# Patient Record
Sex: Female | Born: 1970 | Race: White | Hispanic: No | Marital: Married | Smoking: Never smoker
Health system: Southern US, Community
[De-identification: ages and names within clinical notes are randomized; demographics above are authoritative.]

## PROBLEM LIST (undated history)

## (undated) HISTORY — PX: DILATION AND CURETTAGE OF UTERUS: SHX78

---

## 2000-06-16 ENCOUNTER — Other Ambulatory Visit: Admission: RE | Admit: 2000-06-16 | Discharge: 2000-06-16 | Payer: Self-pay | Admitting: Obstetrics and Gynecology

## 2005-12-27 ENCOUNTER — Ambulatory Visit: Payer: Self-pay | Admitting: Internal Medicine

## 2006-12-17 ENCOUNTER — Ambulatory Visit: Payer: Self-pay | Admitting: Internal Medicine

## 2007-01-15 ENCOUNTER — Ambulatory Visit: Payer: Self-pay | Admitting: Internal Medicine

## 2007-01-23 ENCOUNTER — Ambulatory Visit: Payer: Self-pay | Admitting: Internal Medicine

## 2007-02-27 ENCOUNTER — Ambulatory Visit: Payer: Self-pay | Admitting: Internal Medicine

## 2008-09-12 ENCOUNTER — Encounter: Payer: Self-pay | Admitting: Internal Medicine

## 2009-10-25 ENCOUNTER — Ambulatory Visit: Payer: Self-pay | Admitting: Internal Medicine

## 2009-10-25 DIAGNOSIS — M549 Dorsalgia, unspecified: Secondary | ICD-10-CM | POA: Insufficient documentation

## 2009-11-07 ENCOUNTER — Encounter: Admission: RE | Admit: 2009-11-07 | Discharge: 2009-11-28 | Payer: Self-pay | Admitting: Internal Medicine

## 2009-11-28 ENCOUNTER — Encounter: Payer: Self-pay | Admitting: Internal Medicine

## 2010-11-11 ENCOUNTER — Emergency Department (HOSPITAL_BASED_OUTPATIENT_CLINIC_OR_DEPARTMENT_OTHER)
Admission: EM | Admit: 2010-11-11 | Discharge: 2010-11-11 | Payer: Self-pay | Source: Home / Self Care | Admitting: Emergency Medicine

## 2010-11-20 NOTE — Assessment & Plan Note (Signed)
Summary: back pain//ccm   Vital Signs:  Patient profile:   40 year old female Weight:      201 pounds Temp:     98.9 degrees F Pulse rate:   88 / minute Resp:     12 per minute BP sitting:   118 / 76  (left arm)  Vitals Entered By: Gladis Riffle, RN (October 25, 2009 10:24 AM)   History of Present Illness: October---developed back pain during a month long hx of cough. Location Mid-low back---can extend superiorly. increase pain with bending liftin, twisting.  Pain is acute with some movements but the discomfort is constant.  Once in a while she wonders whether pain extends to right leg  All other systems reviewed and were negative   Preventive Screening-Counseling & Management  Alcohol-Tobacco     Smoking Status: never  Allergies: 1)  ! Erythromycin 2)  ! Pcn 3)  ! Amoxicillin  Comments:  Nurse/Medical Assistant: c/o pain at base of spine intermittent x 3 months but worse last 3-4 weeks  The patient's medications and allergies were reviewed with the patient and were updated in the Medication and Allergy Lists. Gladis Riffle, RN (October 25, 2009 10:26 AM)  Past History:  Past Medical History: Last updated: 06/24/2007 Unremarkable  Past Surgical History: Last updated: 06/24/2007 Denies surgical history  Family History: Last updated: 06/24/2007 Family History of Alcoholism/Addiction Family History Diabetes 1st degree relative Family History Ovarian cancer Family History Uterine cancer Family History of Cardiovascular disorder  Social History: Last updated: 06/24/2007 Occupation: Married Never Smoked Alcohol use-yes Drug use-no Regular exercise-no  Risk Factors: Exercise: no (06/24/2007)  Risk Factors: Smoking Status: never (10/25/2009)  Review of Systems       All other systems reviewed and were negative   Physical Exam  Head:  normocephalic and atraumatic.   Eyes:  pupils equal and pupils round.   Neck:  No deformities, masses, or tenderness  noted. Chest Wall:  No deformities, masses, or tenderness noted. Lungs:  normal respiratory effort and no intercostal retractions.   Heart:  normal rate and regular rhythm.   Abdomen:  soft and non-tender.   Msk:  No deformity or scoliosis noted of thoracic or lumbar spine.   FROM both hips and LS spine Extremities:  No clubbing, cyanosis, edema, or deformity noted Neurologic:  strength normal in all extremities, gait normal, and DTRs symmetrical and normal in lower extremities   Impression & Recommendations:  Problem # 1:  BACK PAIN (ICD-724.5) sxs have been ongoing for months and are progressive needs furhter evalaution  start with xray may need PT---if no relief then more specific imaging Orders: T-Lumbar Spine Complete, 5 Views ([id number])  Complete Medication List: 1)  Has Iud

## 2010-11-20 NOTE — Miscellaneous (Signed)
Summary: Discharge Summary for PT [facility]  Discharge Summary for PT Services/[facility]   Imported By: Maryln Gottron 12/05/2009 14:08:18  _____________________________________________________________________  External Attachment:    Type:   Image     Comment:   External Document

## 2010-12-20 LAB — HM PAP SMEAR: HM Pap smear: NORMAL

## 2011-01-09 ENCOUNTER — Other Ambulatory Visit: Payer: BC Managed Care – PPO | Admitting: Internal Medicine

## 2011-01-09 DIAGNOSIS — Z0000 Encounter for general adult medical examination without abnormal findings: Secondary | ICD-10-CM

## 2011-01-09 LAB — CBC WITH DIFFERENTIAL/PLATELET
Eosinophils Absolute: 0.1 10*3/uL (ref 0.0–0.7)
Eosinophils Relative: 2.1 % (ref 0.0–5.0)
Lymphocytes Relative: 43.7 % (ref 12.0–46.0)
Lymphs Abs: 2.7 10*3/uL (ref 0.7–4.0)
MCV: 94 fl (ref 78.0–100.0)
Monocytes Relative: 6.1 % (ref 3.0–12.0)
Neutrophils Relative %: 47.5 % (ref 43.0–77.0)
Platelets: 186 10*3/uL (ref 150.0–400.0)

## 2011-01-09 LAB — BASIC METABOLIC PANEL
BUN: 12 mg/dL (ref 6–23)
CO2: 26 mEq/L (ref 19–32)
Calcium: 8.5 mg/dL (ref 8.4–10.5)
Chloride: 107 mEq/L (ref 96–112)
Potassium: 4 mEq/L (ref 3.5–5.1)
Sodium: 138 mEq/L (ref 135–145)

## 2011-01-09 LAB — HEPATIC FUNCTION PANEL
AST: 22 U/L (ref 0–37)
Alkaline Phosphatase: 66 U/L (ref 39–117)
Bilirubin, Direct: 0.1 mg/dL (ref 0.0–0.3)
Total Protein: 6.5 g/dL (ref 6.0–8.3)

## 2011-01-09 LAB — LIPID PANEL
Cholesterol: 186 mg/dL (ref 0–200)
VLDL: 21.2 mg/dL (ref 0.0–40.0)

## 2011-01-09 LAB — POCT URINALYSIS DIPSTICK
Glucose, UA: NEGATIVE
Spec Grav, UA: 1.03
Urobilinogen, UA: 0.2
pH, UA: 5.5

## 2011-01-09 LAB — TSH: TSH: 1.78 u[IU]/mL (ref 0.35–5.50)

## 2011-01-17 ENCOUNTER — Encounter: Payer: Self-pay | Admitting: Internal Medicine

## 2011-01-18 ENCOUNTER — Encounter: Payer: Self-pay | Admitting: Internal Medicine

## 2011-01-21 ENCOUNTER — Ambulatory Visit (INDEPENDENT_AMBULATORY_CARE_PROVIDER_SITE_OTHER): Payer: BC Managed Care – PPO | Admitting: Internal Medicine

## 2011-01-21 ENCOUNTER — Encounter: Payer: Self-pay | Admitting: Internal Medicine

## 2011-01-21 VITALS — BP 112/80 | HR 92 | Temp 98.6°F | Ht 64.0 in | Wt 215.0 lb

## 2011-01-21 DIAGNOSIS — M549 Dorsalgia, unspecified: Secondary | ICD-10-CM

## 2011-01-21 DIAGNOSIS — Z23 Encounter for immunization: Secondary | ICD-10-CM

## 2011-01-21 DIAGNOSIS — Z0000 Encounter for general adult medical examination without abnormal findings: Secondary | ICD-10-CM

## 2011-01-21 NOTE — Progress Notes (Signed)
  Subjective:    Patient ID: Kathy Maddox, female    DOB: 1971-08-31, 40 y.o.   MRN: [id number]  HPI  cpx Does complain of intermittent back pain. This was reviewed one year ago. Images reviewed at that time. No past medical history on file. Past Surgical History  Procedure Date  . Cesarean section   . Dilation and curettage of uterus     reports that she has never smoked. She does not have any smokeless tobacco history on file. She reports that she drinks alcohol. She reports that she does not use illicit drugs. family history includes Alcohol abuse in her paternal grandmother; Cancer in her paternal grandmother; Diabetes in her maternal grandfather and maternal grandmother; and Heart disease in her maternal grandmother. Allergies  Allergen Reactions  . Amoxicillin   . Erythromycin   . Penicillins      Review of Systems  patient denies chest pain, shortness of breath, orthopnea. Denies lower extremity edema, abdominal pain, change in appetite, change in bowel movements. Patient denies rashes, musculoskeletal complaints. No other specific complaints in a complete review of systems.      Objective:   Physical Exam  Well-developed well-nourished female in no acute distress. HEENT exam atraumatic, normocephalic, extraocular muscles are intact. Neck is supple. No jugular venous distention no thyromegaly. Chest clear to auscultation without increased work of breathing. Cardiac exam S1 and S2 are regular. Abdominal exam active bowel sounds, soft, nontender. Extremities no edema. Neurologic exam she is alert without any motor sensory deficits. Gait is normal.      Assessment & Plan:  Well visit- Health maint UTD

## 2011-02-28 ENCOUNTER — Ambulatory Visit (INDEPENDENT_AMBULATORY_CARE_PROVIDER_SITE_OTHER): Payer: BC Managed Care – PPO | Admitting: Internal Medicine

## 2011-02-28 ENCOUNTER — Encounter: Payer: Self-pay | Admitting: Internal Medicine

## 2011-02-28 DIAGNOSIS — J069 Acute upper respiratory infection, unspecified: Secondary | ICD-10-CM

## 2011-02-28 MED ORDER — AZITHROMYCIN 250 MG PO TABS: ORAL_TABLET | ORAL | Status: AC

## 2011-03-03 ENCOUNTER — Encounter: Payer: Self-pay | Admitting: Internal Medicine

## 2011-03-03 DIAGNOSIS — J069 Acute upper respiratory infection, unspecified: Secondary | ICD-10-CM | POA: Insufficient documentation

## 2011-03-03 NOTE — Assessment & Plan Note (Signed)
Begin Z-Pak.Followup if no improvement or worsening.

## 2011-03-03 NOTE — Progress Notes (Signed)
  Subjective:    Patient ID: Kathy Maddox, female    DOB: 05-27-1971, 40 y.o.   MRN: [id number]  HPI Patient presents to clinic for evaluation of cough. Notes approximately one week history of nasal congestion, left maxillary pressure, malaise and cough. Denies fever, chills, shortness breath, wheezing or hemoptysis. Took over-the-counter Sudafed. No other alleviating or exacerbating factors. Allergies reviewed with erythromycin causing GI upset but has tolerated Zithromax in the past. No other complaints.  Reviewed past medical history, medications and allergies  Review of Systems see history of present illness     Objective:   Physical Exam    Physical Exam  Vitals reviewed. Constitutional:  appears well-developed and well-nourished. No distress.  HENT:  Head: Normocephalic and atraumatic.  Right Ear: Tympanic membrane, external ear and ear canal normal.  Left Ear: Tympanic membrane, external ear and ear canal normal.  Nose: Nose normal.  Mouth/Throat: Oropharynx is clear and moist. No oropharyngeal exudate.  Eyes: Conjunctivae and EOM are normal. Pupils are equal, round, and reactive to light. Right eye exhibits no discharge. Left eye exhibits no discharge. No scleral icterus.  Neck: Neck supple. No thyromegaly present.  Cardiovascular: Normal rate, regular rhythm and normal heart sounds.  Exam reveals no gallop and no friction rub.   No murmur heard. Pulmonary/Chest: Effort normal and breath sounds normal. No respiratory distress.  has no wheezes.  has no rales.  Lymphadenopathy:   no cervical adenopathy.  Neurological:  is alert.  Skin: Skin is warm and dry.  not diaphoretic.  Psychiatric: normal mood and affect.      Assessment & Plan:

## 2011-06-03 ENCOUNTER — Ambulatory Visit: Payer: BC Managed Care – PPO | Admitting: Family Medicine

## 2011-06-03 ENCOUNTER — Ambulatory Visit (INDEPENDENT_AMBULATORY_CARE_PROVIDER_SITE_OTHER): Payer: BC Managed Care – PPO | Admitting: Internal Medicine

## 2011-06-03 ENCOUNTER — Encounter: Payer: Self-pay | Admitting: Internal Medicine

## 2011-06-03 DIAGNOSIS — IMO0001 Reserved for inherently not codable concepts without codable children: Secondary | ICD-10-CM

## 2011-06-03 DIAGNOSIS — R21 Rash and other nonspecific skin eruption: Secondary | ICD-10-CM

## 2011-06-03 DIAGNOSIS — M7918 Myalgia, other site: Secondary | ICD-10-CM

## 2011-06-03 MED ORDER — TRIAMCINOLONE ACETONIDE 0.025 % EX OINT: TOPICAL_OINTMENT | Freq: Two times a day (BID) | CUTANEOUS | Status: DC

## 2011-06-05 DIAGNOSIS — R21 Rash and other nonspecific skin eruption: Secondary | ICD-10-CM | POA: Insufficient documentation

## 2011-06-05 DIAGNOSIS — M7918 Myalgia, other site: Secondary | ICD-10-CM | POA: Insufficient documentation

## 2011-06-05 NOTE — Assessment & Plan Note (Signed)
Attempt triamcinolone to affected area twice a day.Followup if no improvement or worsening.

## 2011-06-05 NOTE — Assessment & Plan Note (Signed)
Likely ligamental. Exam does not suggest hip involvement. Attempt Advil p.r.n. With food and no other anti-inflammatories.Followup if no improvement or worsening.

## 2011-06-05 NOTE — Progress Notes (Signed)
  Subjective:    Patient ID: CHERELLE MIDKIFF, female    DOB: Aug 19, 1971, 40 y.o.   MRN: [id number]  HPI patient presents to clinic for evaluation of rash. Next history of rash on right upper thigh. Is not spreading. No exacerbating or alleviating factors. No associated pain. Positive itching. Is not any dermatomal distribution. Also notes intermittent waxing and waning right inguinal pain along musculoskeletal distribution. No injury or trauma to the hip and does not worsen with ambulation. No other complaints  Reviewed past medical history, medications and allergies  Review of Systems see history of present illness     Objective:   Physical Exam  Nursing note and vitals reviewed. Constitutional: She appears well-developed and well-nourished.  HENT:  Head: Normocephalic and atraumatic.  Right Ear: External ear normal.  Left Ear: External ear normal.  Eyes: Conjunctivae are normal. No scleral icterus.  Musculoskeletal:       Range of motion right hip. No reproducible pain on flexion extension internal or external rotation. Gait normal  Neurological: She is alert.  Skin: Skin is warm and dry.       Right medial thigh demonstrates maculopapular erythematous rash.no drainage. no dermatomal distribution.  Psychiatric: She has a normal mood and affect.          Assessment & Plan:

## 2015-06-08 ENCOUNTER — Encounter (HOSPITAL_BASED_OUTPATIENT_CLINIC_OR_DEPARTMENT_OTHER): Payer: Self-pay | Admitting: Emergency Medicine

## 2015-06-08 ENCOUNTER — Emergency Department (HOSPITAL_BASED_OUTPATIENT_CLINIC_OR_DEPARTMENT_OTHER): Payer: BLUE CROSS/BLUE SHIELD

## 2015-06-08 ENCOUNTER — Encounter: Payer: Self-pay | Admitting: Osteopathic Medicine

## 2015-06-08 ENCOUNTER — Ambulatory Visit (INDEPENDENT_AMBULATORY_CARE_PROVIDER_SITE_OTHER): Payer: BLUE CROSS/BLUE SHIELD | Admitting: Osteopathic Medicine

## 2015-06-08 ENCOUNTER — Emergency Department (HOSPITAL_BASED_OUTPATIENT_CLINIC_OR_DEPARTMENT_OTHER)
Admission: EM | Admit: 2015-06-08 | Discharge: 2015-06-08 | Disposition: A | Discharge status: Home / Self Care | Payer: BLUE CROSS/BLUE SHIELD | Attending: Emergency Medicine | Admitting: Emergency Medicine

## 2015-06-08 VITALS — BP 142/78 | HR 116 | Temp 102.7°F | Ht 64.0 in | Wt 253.0 lb

## 2015-06-08 DIAGNOSIS — Z79899 Other long term (current) drug therapy: Secondary | ICD-10-CM | POA: Insufficient documentation

## 2015-06-08 DIAGNOSIS — R000 Tachycardia, unspecified: Secondary | ICD-10-CM | POA: Insufficient documentation

## 2015-06-08 DIAGNOSIS — J159 Unspecified bacterial pneumonia: Secondary | ICD-10-CM | POA: Insufficient documentation

## 2015-06-08 DIAGNOSIS — J189 Pneumonia, unspecified organism: Secondary | ICD-10-CM

## 2015-06-08 DIAGNOSIS — R5081 Fever presenting with conditions classified elsewhere: Secondary | ICD-10-CM

## 2015-06-08 DIAGNOSIS — R059 Cough, unspecified: Secondary | ICD-10-CM

## 2015-06-08 DIAGNOSIS — R509 Fever, unspecified: Secondary | ICD-10-CM | POA: Diagnosis present

## 2015-06-08 DIAGNOSIS — Z880 Allergy status to penicillin: Secondary | ICD-10-CM | POA: Insufficient documentation

## 2015-06-08 DIAGNOSIS — R05 Cough: Secondary | ICD-10-CM

## 2015-06-08 DIAGNOSIS — R011 Cardiac murmur, unspecified: Secondary | ICD-10-CM

## 2015-06-08 LAB — RAPID STREP SCREEN (MED CTR MEBANE ONLY): Streptococcus, Group A Screen (Direct): NEGATIVE

## 2015-06-08 MED ORDER — MORPHINE SULFATE (PF) 4 MG/ML IV SOLN
4.0000 mg | Freq: Once | INTRAVENOUS | Status: DC
  Filled 2015-06-08: qty 1

## 2015-06-08 MED ORDER — IBUPROFEN 800 MG PO TABS
800.0000 mg | ORAL_TABLET | Freq: Once | ORAL | Status: AC
  Administered 2015-06-08: 800 mg via ORAL
  Filled 2015-06-08: qty 1

## 2015-06-08 MED ORDER — LEVOFLOXACIN 750 MG PO TABS: 750.0000 mg | ORAL_TABLET | Freq: Every day | ORAL | Status: AC

## 2015-06-08 MED ORDER — BENZONATATE 200 MG PO CAPS: 200.0000 mg | ORAL_CAPSULE | Freq: Three times a day (TID) | ORAL | Status: DC | PRN

## 2015-06-08 MED ORDER — LEVOFLOXACIN IN D5W 750 MG/150ML IV SOLN
750.0000 mg | Freq: Once | INTRAVENOUS | Status: AC
  Administered 2015-06-08: 750 mg via INTRAVENOUS
  Filled 2015-06-08: qty 150

## 2015-06-08 MED ORDER — IPRATROPIUM-ALBUTEROL 18-103 MCG/ACT IN AERO: 2.0000 | INHALATION_SPRAY | RESPIRATORY_TRACT | Status: DC | PRN

## 2015-06-08 MED ORDER — SODIUM CHLORIDE 0.9 % IV BOLUS (SEPSIS)
1000.0000 mL | Freq: Once | INTRAVENOUS | Status: AC
  Administered 2015-06-08: 1000 mL via INTRAVENOUS

## 2015-06-08 MED ORDER — IPRATROPIUM-ALBUTEROL 0.5-2.5 (3) MG/3ML IN SOLN
3.0000 mL | Freq: Once | RESPIRATORY_TRACT | Status: AC
  Administered 2015-06-08: 3 mL via RESPIRATORY_TRACT

## 2015-06-08 MED ORDER — LEVOFLOXACIN 750 MG PO TABS
750.0000 mg | ORAL_TABLET | Freq: Every day | ORAL | Status: DC
Start: 2015-06-08 — End: 2015-06-08

## 2015-06-08 MED ORDER — ACETAMINOPHEN 325 MG PO TABS
650.0000 mg | ORAL_TABLET | Freq: Once | ORAL | Status: AC | PRN
  Administered 2015-06-08: 650 mg via ORAL
  Filled 2015-06-08: qty 2

## 2015-06-08 MED ORDER — BENZONATATE 100 MG PO CAPS: 100.0000 mg | ORAL_CAPSULE | Freq: Three times a day (TID) | ORAL | Status: AC | PRN

## 2015-06-08 NOTE — ED Notes (Signed)
Pt doesn't want morphine, gave motrin 

## 2015-06-08 NOTE — ED Notes (Signed)
Pt complains of sore throat, dry cough. Denies SOB. Lung sounds clear, throat pink

## 2015-06-08 NOTE — Discharge Instructions (Signed)

## 2015-06-08 NOTE — ED Provider Notes (Signed)
CSN: [id number]     Arrival date & time 06/08/15  1451 History   First MD Initiated Contact with Patient 06/08/15 1456     Chief Complaint  Patient presents with  . Sore Throat  . Fever  . Cough     (Consider location/radiation/quality/duration/timing/severity/associated sxs/prior Treatment) HPI   A generally healthy 44 year old obese female presenting for evaluation of fever and cough. Patient report for the past 3 days she has had subjective fever, chills, body aches, dry cough, pleuritic chest pain, and mild sore throat. Patient recently Back from a trip to the beach and was sick during the trip. She denies severe headache, runny nose, sneezing, ear pain, or sore throat. No nausea vomiting diarrhea, no shortness of breath, dyspnea on exertion, hemoptysis, back pain, abdominal pain, or rash. She tries taking home medication include Benadryl, Delsym, and Advil without adequate relief. She initially went to [facility] for this complaint today but was recommended to come to ER for additional blood work. Patient is not a smoker. No prior history of PE or DVT and no other significant risk factors for blood clot.  History reviewed. No pertinent past medical history. Past Surgical History  Procedure Laterality Date  . Cesarean section    . Dilation and curettage of uterus     Family History  Problem Relation Age of Onset  . Diabetes Maternal Grandmother   . Heart disease Maternal Grandmother   . Diabetes Maternal Grandfather   . Alcohol abuse Paternal Grandmother   . Cancer Paternal Grandmother     ovarian   Social History  Substance Use Topics  . Smoking status: Never Smoker   . Smokeless tobacco: Never Used  . Alcohol Use: Yes   OB History    No data available     Review of Systems  All other systems reviewed and are negative.     Allergies  Amoxicillin; Erythromycin; and Penicillins  Home Medications   Prior to Admission medications   Medication Sig Start Date End Date  Taking? Authorizing Provider  albuterol-ipratropium (COMBIVENT) 18-103 MCG/ACT inhaler Inhale 2 puffs into the lungs every 4 (four) hours as needed for wheezing or shortness of breath. 06/08/15   Sunnie Nielsen, DO  benzonatate (TESSALON) 200 MG capsule Take 1 capsule (200 mg total) by mouth 3 (three) times daily as needed for cough. 06/08/15   Sunnie Nielsen, DO  levofloxacin (LEVAQUIN) 750 MG tablet Take 1 tablet (750 mg total) by mouth daily. 06/08/15   Sunnie Nielsen, DO  norgestimate-ethinyl estradiol (ORTHO-CYCLEN,SPRINTEC,PREVIFEM) 0.25-35 MG-MCG tablet Take 1 tablet by mouth daily.    Historical Provider, MD   BP 144/49 mmHg  Pulse 126  Temp(Src) 102.2 F (39 C) (Oral)  Resp 22  Ht  (1.626 m)  Wt 250 lb (113.399 kg)  BMI 42.89 kg/m2  SpO2 97%  LMP 05/23/2015 Physical Exam  Constitutional: She is oriented to person, place, and time. She appears well-developed and well-nourished. No distress.  Moderately obese Caucasian female, nontoxic in appearance  HENT:  Head: Atraumatic.  Right Ear: External ear normal.  Left Ear: External ear normal.  Nose: Nose normal.  Mouth/Throat: Oropharynx is clear and moist.  Eyes: Conjunctivae are normal.  Neck: Normal range of motion. Neck supple.  No nuchal rigidity  Cardiovascular: Intact distal pulses.   Tachycardia without murmurs rubs or gallops.  Pulmonary/Chest: Effort normal and breath sounds normal. No respiratory distress. She has no wheezes. She has no rales. She exhibits no tenderness.  Abdominal: Soft.  Musculoskeletal:  She exhibits no edema.  Lymphadenopathy:    She has no cervical adenopathy.  Neurological: She is alert and oriented to person, place, and time.  Skin: No rash noted.  Psychiatric: She has a normal mood and affect.  Nursing note and vitals reviewed.   ED Course  Procedures (including critical care time)  Patient presents with fever, cough, and sore throat. Strep test is negative, chest x-ray  demonstratea right upper lobe pneumonia with involvement of portions of the anterior segment and axillary subsegment of the right upper lobe. This is consistence with community-acquired pneumonia. She is febrile with a temp of 102.2 and tachycardic, tachypneic but no hypertension. No hypoxia. Low suspicion for PE using Wells criteria. Since patient able to tolerates by mouth, recommend hydration while in the ED. Tylenol given for her fever. I offer antibiotic but patient states she prefers to take it later when she has food in the stomach, as previous antibiotic upsets her stomach. I do not think patient will require hospital admission at this time. I will continue to monitor patient and likely discharged with Levaquin for treatments of CAP.    5:47 PM Patient received IV fluid, IV antibiotic, and fever reducing medication while in the ED. Her symptoms improved greatly. She is now stable for discharge. Return precautions discussed.  Labs Review Labs Reviewed  RAPID STREP SCREEN (NOT AT [facility])  CULTURE, GROUP A STREP    Imaging Review Dg Chest 2 View  06/08/2015   CLINICAL DATA:  Cough and fever for several days  EXAM: CHEST  2 VIEW  COMPARISON:  None.  FINDINGS: There is patchy airspace consolidation in the right upper lobe, involving portions of the anterior segment and axillary subsegment of the right upper lobe. Lungs elsewhere are clear. Heart size and pulmonary vascularity are normal. No adenopathy. No bone lesions.  IMPRESSION: Right upper lobe pneumonia with involvement of portions of the anterior segment and axillary subsegment of the right upper lobe.   Electronically Signed   By: Bretta Bang III M.D.   On: 06/08/2015 15:18   I have personally reviewed and evaluated these images and lab results as part of my medical decision-making.   EKG Interpretation None      MDM   Final diagnoses:  CAP (community acquired pneumonia)    BP 132/72 mmHg  Pulse 78  Temp(Src) 98.7 F (37.1  C) (Oral)  Resp 16  Ht 5\' 4"  (1.626 m)  Wt 250 lb (113.399 kg)  BMI 42.89 kg/m2  SpO2 97%  LMP 05/23/2015  I have reviewed nursing notes and vital signs. I personally viewed the imaging tests through PACS system and agrees with radiologist's intepretation I reviewed available ER/hospitalization records through the EMR     Fayrene Helper, PA-C 06/08/15 1749  Elwin Mocha, MD 06/08/15 [phone]

## 2015-06-08 NOTE — Progress Notes (Signed)
CC: cough, feeling sick  HPI: Kathy Maddox is a 44 y.o. female who presents to [facility]  today for new patient and feeling sick  Reports coughing,  Onset: 3-4 days ago Duration: 3-4 days Quality: occasionally productive, no blood Associated symptoms: no SOB, low appetite, no sick contacts, no chest pain but some doscomfort in chest with coughing, no history of asthma or respiratory problems.  Modifying factors: Tried advil, benadryl and Delsym with little benefit. No exposure to nursing homes or hospitals.    Past medical, social and family history reviewed:  Past Medical History  Reports Miscarriage in 02/2015, previous cardiac workup she thinks for murmur in pregnancy, doesn't think she has had Echo performed  Past Surgical History  Procedure Laterality Date  . Cesarean section    . Dilation and curettage of uterus     Social History  Substance Use Topics  . Smoking status: Never Smoker   . Smokeless tobacco: Never Used  . Alcohol Use: Yes   Family History  Problem Relation Age of Onset  . Diabetes Maternal Grandmother   . Heart disease Maternal Grandmother   . Diabetes Maternal Grandfather   . Alcohol abuse Paternal Grandmother   . Cancer Paternal Grandmother     ovarian    Current Outpatient Prescriptions  Medication Sig Dispense Refill  . norgestimate-ethinyl estradiol (ORTHO-CYCLEN,SPRINTEC,PREVIFEM) 0.25-35 MG-MCG tablet Take 1 tablet by mouth daily.     No current facility-administered medications for this visit.   Allergies  Allergen Reactions  . Amoxicillin   . Erythromycin   . Penicillins      Review of Systems: CONSTITUTIONAL: (+) fever/chills, no unintentional weight changes HEAD/EYES/EARS/NOSE: No headache/vision change or hearing change CARDIAC: No chest pain/pressure/palpitations, no orthopnea RESPIRATORY: (+) cough - productive, mild shortness of breath/wheeze GASTROINTESTINAL: No  nausea/vomiting/abdominal pain/blood in stool/diarrhea/constipation MUSCULOSKELETAL: (+) myalgia/arthralgia GENITOURINARY: No incontinence, No abnormal genital bleeding/discharge SKIN: No rash/wounds/concerning lesions HEM/ONC: No easy bruising/bleeding, no abnormal lymph node PSYCHIATRIC: No concerns with depression/anxiety or sleep problems    Exam:  BP 142/78 mmHg  Pulse 116  Temp(Src) 102.7 F (39.3 C) (Oral)  Ht 5\' 4"  (1.626 m)  Wt 253 lb (114.76 kg)  BMI 43.41 kg/m2  SpO2 96%  LMP 05/23/2015 Repeat SaO2 and HR after breathing treatment: 96% and 126 bpm Constitutional: VS see above. General Appearance: alert, well-developed, well-nourished, NAD, anxious Eyes: Normal lids and conjunctive, non-icteric sclera, PERRLA Ears, Nose, Mouth, Throat: Normal external inspection ears/nares/mouth/lips/gums, Normal TM bilaterally, MMM, posterior pharynx without erythema/exudate Neck: No masses, trachea midline. No thyroid tenderness/mass appreciated but thyroid is mild enlarged.  Respiratory: Normal respiratory effort. No dullness/hyper-resonance to percussion. Breath sounds (+) wheeze/ronchi bilaterally, worse on L, improved after breathing treatment in office however SaO2 was no better Cardiovascular: S1/S2 normal, (+) systolic murmur most pronounced at LSB, Tachycardic, no rub/gallop auscultated. No carotid bruit or JVD. No abdominal aortic bruit. Pedal pulse II/IV bilaterally DP and PT. No lower extremity edema. Gastrointestinal: Nontender, no masses. No hepatomegaly, no splenomegaly. No hernia appreciated. Rectal exam deferred.  Musculoskeletal: Gait normal. No clubbing/cyanosis of digits.  Neurological: No cranial nerve deficit on limited exam. Motor and sensation intact and symmetric Psychiatric: Normal judgment/insight. Anxious mood and affect. Oriented x3.    No results found for this or any previous visit (from the past 72 hour(s)). Dg Chest 2 View  06/08/2015   CLINICAL DATA:   Cough and fever for several days  EXAM: CHEST  2 VIEW  COMPARISON:  None.  FINDINGS: There is patchy airspace consolidation in the right upper lobe, involving portions of the anterior segment and axillary subsegment of the right upper lobe. Lungs elsewhere are clear. Heart size and pulmonary vascularity are normal. No adenopathy. No bone lesions.  IMPRESSION: Right upper lobe pneumonia with involvement of portions of the anterior segment and axillary subsegment of the right upper lobe.   Electronically Signed   By: Bretta Bang III M.D.   On: 06/08/2015 15:18     ASSESSMENT/PLAN:  (+)SIRS/Sepsis likely due to community acquired pneumonia - based on tachycardia and temperature alone with suspicion for pneumonia. I recommended further evaluation in ER for expedient diagnosis/treatment, likely will require early goa directed therapy for sepsis, and rule out PE.   CAP (community acquired pneumonia) -, ipratropium-albuterol (DUONEB) 0.5-2.5 (3) MG/3ML nebulizer solution 3 mL in office, CANCELED: DG Chest 2 View, CANCELED: CBC with Differential/Platelet, CANCELED: Blood gas, arterial, CANCELED: COMPLETE METABOLIC PANEL WITH GFR  Cough - Plan: benzonatate (TESSALON) 200 MG capsule, ipratropium-albuterol (DUONEB) 0.5-2.5 (3) MG/3ML nebulizer solution 3 mL, CANCELED: DG Chest 2 View, CANCELED: COMPLETE METABOLIC PANEL WITH GFR  Fever presenting with conditions classified elsewhere - Plan: CANCELED: CBC with Differential/Platelet, CANCELED: COMPLETE METABOLIC PANEL WITH GFR  Tachycardia up to 126 - most likely due to illness but will eval w/ EKG, labs cancelled b/c patient went to ER - Plan, EKG 12-Lead see below, CANCELED: CBC with Differential/Platelet, CANCELED: D-Dimer, Quantitative  Heart murmur - Plan: EKG 12-Lead, obtain cardiology records  EKG INTERPRETATION: Rate 114, no ST/T changes concerning for ischemia/infarct, Sinus rhythm, tachycardia, RBB minimal WELLS SCORE 1.5 - LOW RISK  Patient  opts to go to ER. I personally Called [facility] ER - Asencion Islam in triage was given sign-out, patient has husband to drive her to ER. Orders and prescriptions cancelled here.

## 2015-06-08 NOTE — ED Notes (Signed)
Patient states she went to [facility] [place] and was sent here for blood work. The patient reports that she is having a sore throat and cough.

## 2015-06-08 NOTE — Patient Instructions (Signed)

## 2015-06-08 NOTE — ED Notes (Signed)
Pt given 2 rx at discharge. Denies any pain

## 2015-06-10 LAB — CULTURE, GROUP A STREP: STREP A CULTURE: NEGATIVE

## 2015-06-12 ENCOUNTER — Ambulatory Visit: Payer: BLUE CROSS/BLUE SHIELD | Admitting: Osteopathic Medicine

## 2017-02-28 IMAGING — DX DG CHEST 2V
2 series · 2 of 2 positions shown · non-contrast
Comparison: None.

CLINICAL DATA: Cough and fever for several days

EXAM:
CHEST  2 VIEW

[chest pa]
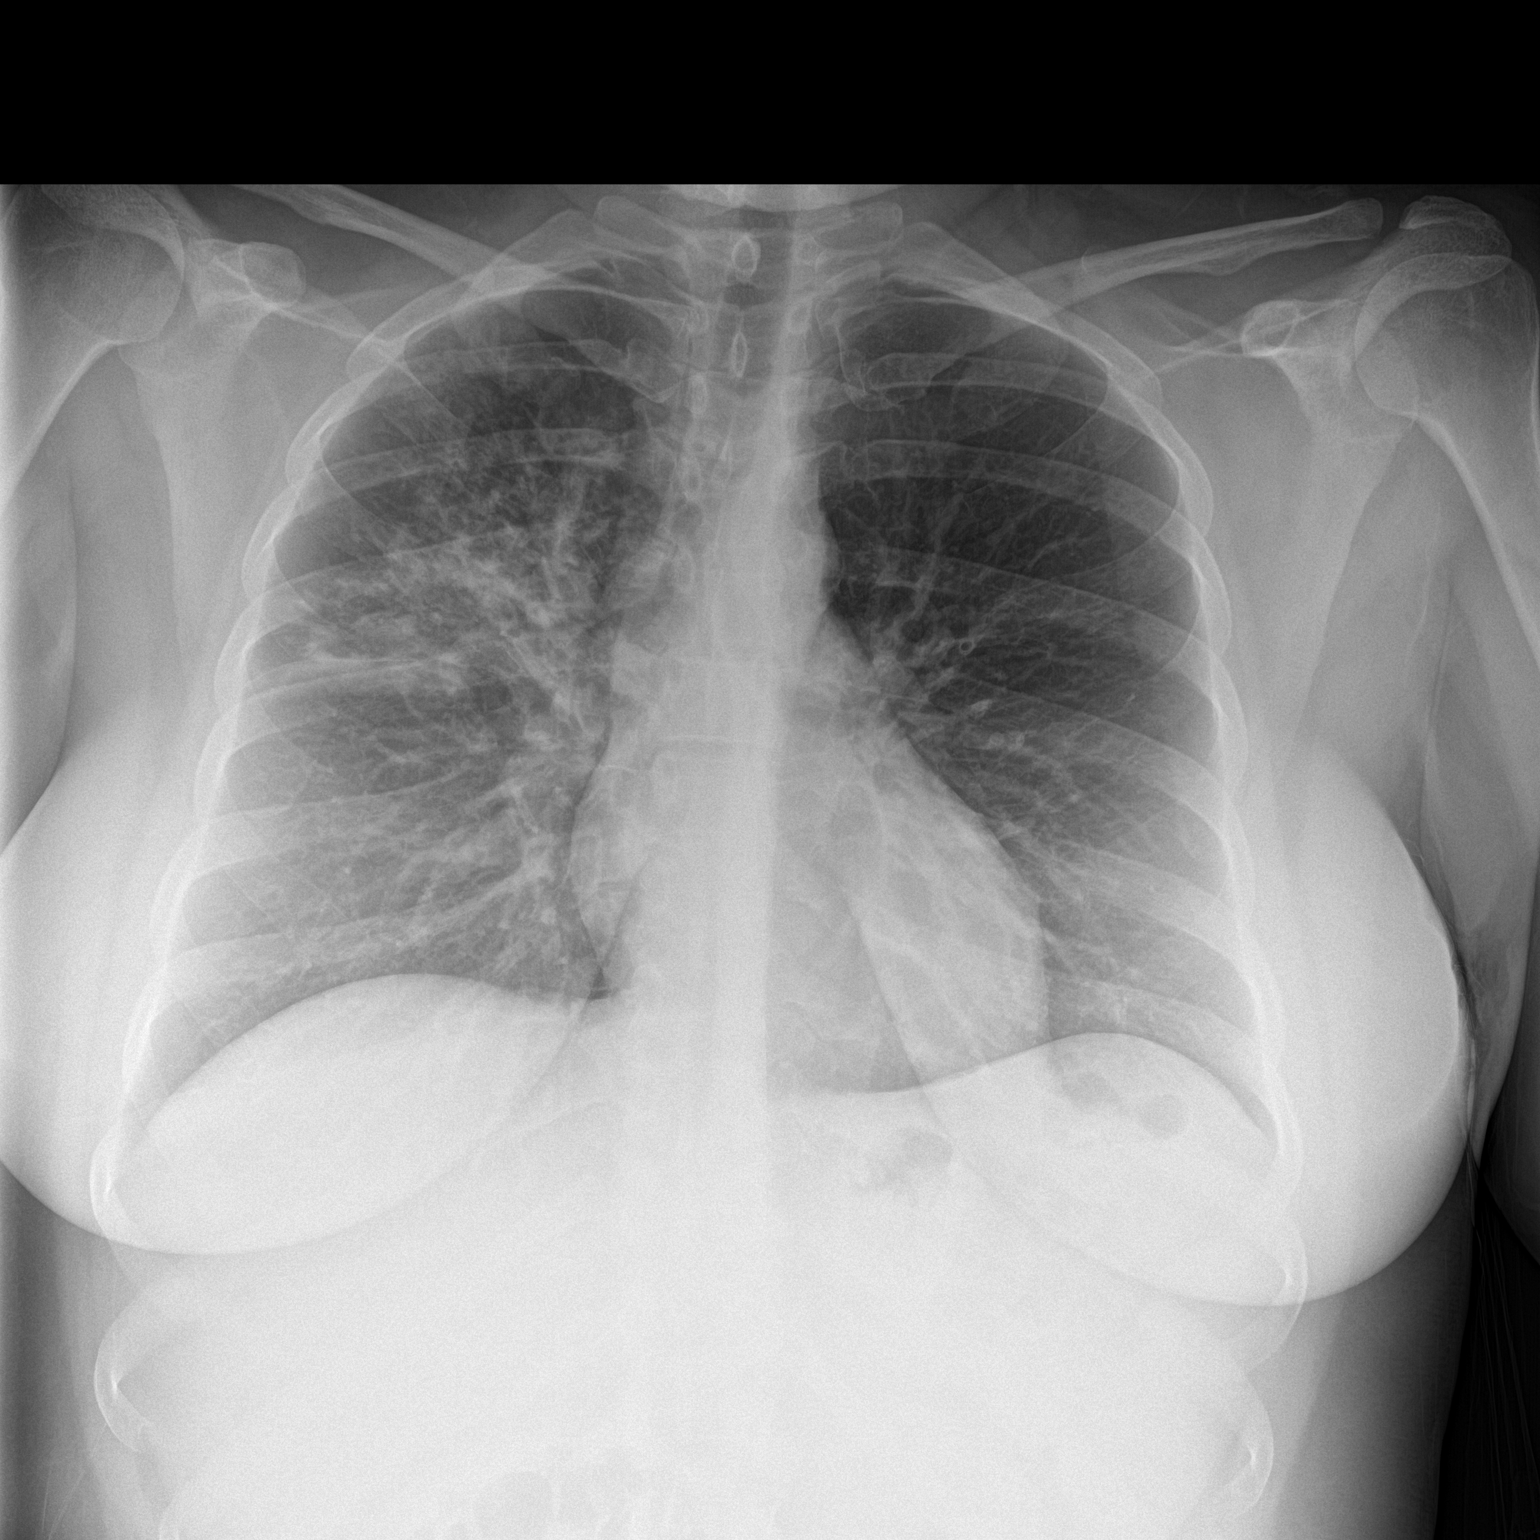

[chest lat]
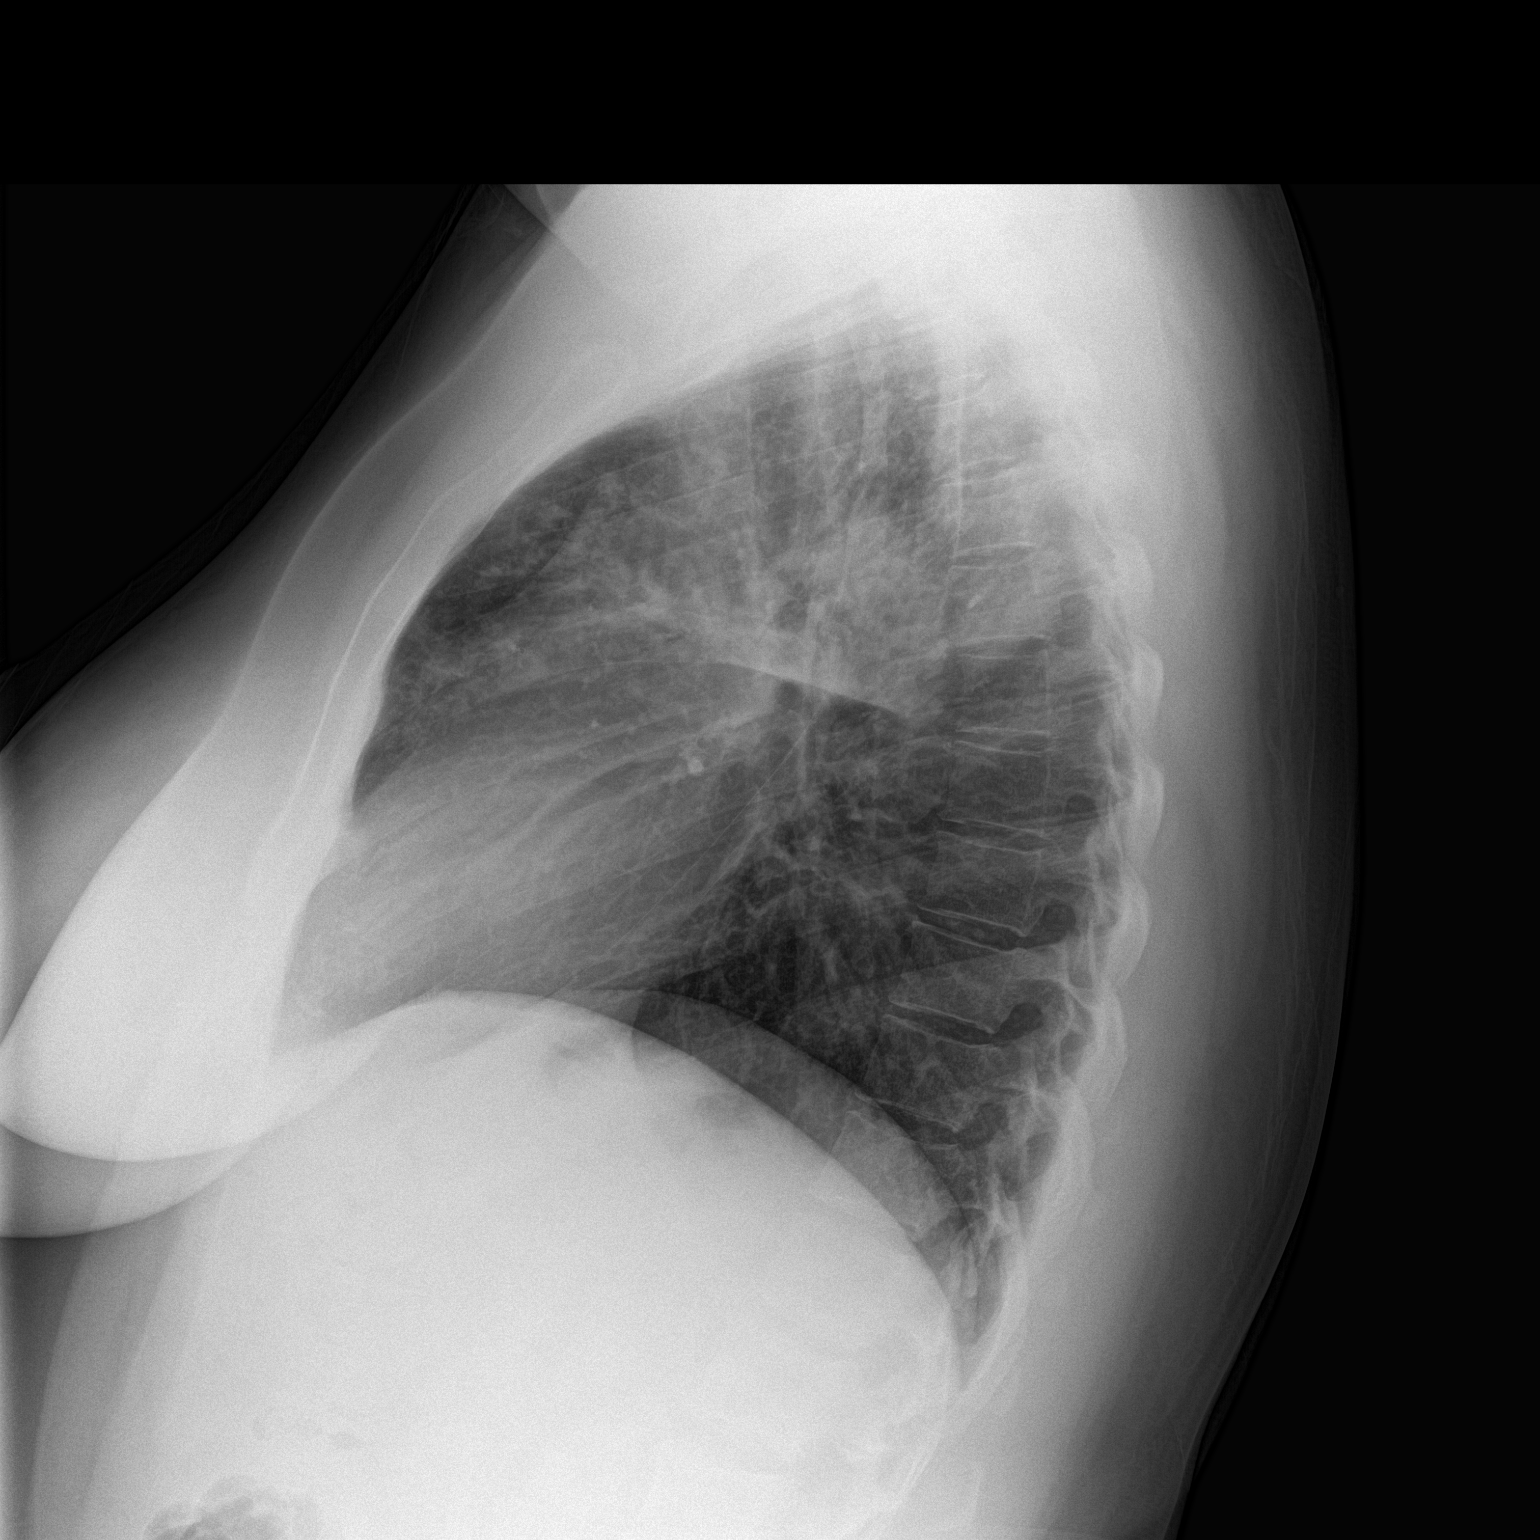

[2 of 2 positions shown; findings below may reference images not displayed]

FINDINGS: There is patchy airspace consolidation in the right upper lobe,
involving portions of the anterior segment and axillary subsegment
of the right upper lobe. Lungs elsewhere are clear. Heart size and
pulmonary vascularity are normal. No adenopathy. No bone lesions.
IMPRESSION: Right upper lobe pneumonia with involvement of portions of the
anterior segment and axillary subsegment of the right upper lobe.
